# Patient Record
Sex: Male | Born: 2005 | Race: White | Hispanic: Yes | Marital: Single | Smoking: Never smoker
Health system: Southern US, Community
[De-identification: ages and names within clinical notes are randomized; demographics above are authoritative.]

---
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
## 2023-01-14 IMAGING — DX DG FINGER LITTLE 2+V*R*
3 series · 3 of 3 positions shown · non-contrast
Comparison: None.

CLINICAL DATA: Volleyball injury

EXAM:
RIGHT LITTLE FINGER 2+V

[finger ap]
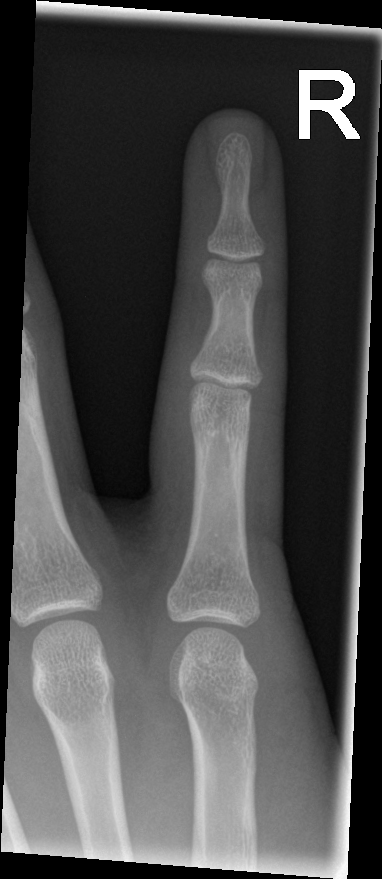

[finger obl]
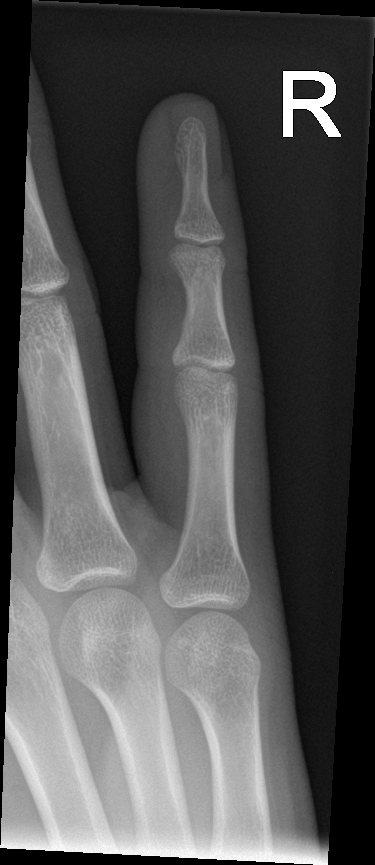

[finger lat]
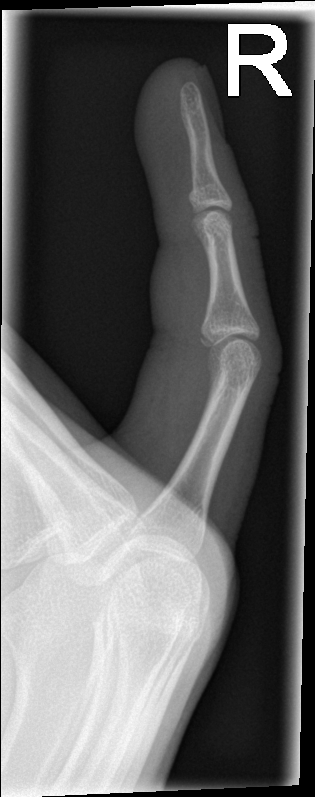

[3 of 3 positions shown; findings below may reference images not displayed]

FINDINGS: Minimally displaced fracture along the volar base of the middle
phalanx, could reflect a volar plate avulsion fracture. No other
acute fracture or traumatic malalignment.
IMPRESSION: Small avulsion type fracture at the base of the fifth middle
phalanx, concern for volar plate avulsion, correlate with mechanism
and clinical symptoms.

## 2023-05-27 NOTE — Patient Instructions (Signed)
 Patient Education Table of Contents Acetaminophen  Capsules or Tablets Cyclobenzaprine Tablets Docusate Capsules or Tablets Lumbar Spine Fracture Oxycodone Capsules or Tablets  To view videos and all your education online visit, [email or web address] or scan this QR code with your smartphone. Access to this content will expire in one year. Acetaminophen  Capsules or Tablets What is this medication? ACETAMINOPHEN  (a set a MEE noe fen) treats mild to moderate pain. It may also be used to reduce fever. This medicine may be used for other purposes; ask your health care provider or pharmacist if you have questions. COMMON BRAND NAME(S): Aceta, Actamin, Anacin Aspirin Free, Aphen, Genapap, Genebs, Mapap, Pain & Fever, Pain and Fever, PAIN RELIEF, PAIN RELIEF Extra Strength, Pain Reliever, Panadol, PHARBETOL, Plus PHARMA, Q-Pap, Q-Pap Extra Strength, Tylenol , Tylenol  CrushableTablet, Tylenol  Extra Strength, Tylenol  Regular Strength, XS No Aspirin, XS Pain Reliever What should I tell my care team before I take this medication? They need to know if you have any of these conditions: Frequently drink alcohol Liver disease An unusual or allergic reaction to acetaminophen , other medications, foods, dyes, or preservatives Pregnant or trying to get pregnant Breastfeeding How should I use this medication? Take this medication by mouth with a glass of water. Take it as directed on the package or prescription label. Take your medication at regular intervals. Do not take your medication more often than directed. Talk to your care team about the use of this medication in children. While it may be prescribed for children as young as 68 years of age for selected conditions, precautions do apply. Overdosage: If you think you have taken too much of this medicine contact a poison control center or emergency room at once. NOTE: This medicine is only for you. Do not share this medicine with others. What if  I miss a dose? If you miss a dose, take it as soon as you can. If it is almost time for your next dose, take only that dose. Do not take double or extra doses. What may interact with this medication? Alcohol Imatinib Isoniazid Other medications with acetaminophen  This list may not describe all possible interactions. Give your health care provider a list of all the medicines, herbs, non-prescription drugs, or dietary supplements you use. Also tell them if you smoke, drink alcohol, or use illegal drugs. Some items may interact with your medicine. What should I watch for while using this medication? Tell your care team if the pain lasts more than 10 days (5 days for children), if it gets worse, or if there is a new or different kind of pain. Also, check with your care team if a fever lasts for more than 3 days. Do not take other medications that contain acetaminophen  with this medication. Many non-prescription medications contain acetaminophen . Always read labels carefully. If you have questions, ask your care team. If you take too much acetaminophen , get medical help right away. Too much acetaminophen  can be very dangerous and cause liver damage. Even if you do not have symptoms, it is important to get help right away. What side effects may I notice from receiving this medication? Side effects that you should report to your care team as soon as possible: Allergic reactions--skin rash, itching, hives, swelling of the face, lips, tongue, or throat Liver injury--right upper belly pain, loss of appetite, nausea, light-colored stool, dark yellow or brown urine, yellowing skin or eyes, unusual weakness or fatigue Redness, blistering, peeling, or loosening of the skin, including inside the mouth Side effects that usually do  not require medical attention (report to your care team if they continue or are bothersome): Headache Nausea Trouble sleeping Upset stomach This list may not describe all possible  side effects. Call your doctor for medical advice about side effects. You may report side effects to FDA at 1-800-FDA-1088. Where should I keep my medication? Keep out of the reach of children and pets. Store at room temperature between 20 and 25 degrees C (68 and 77 degrees F). Protect from moisture and heat. Get rid of any unused medication after the expiration date. To get rid of medications that are no longer wanted or have expired: Take the medication to a medication take-back program. Ask your pharmacy or law enforcement to find a location. If you cannot return the medication, check the label or package insert to see if the medication should be thrown out in the garbage or flushed down the toilet. If you are not sure, ask your care team. If it is safe to put it in the trash, empty the medication out of the container. Mix the medication with cat litter, dirt, coffee grounds, or other unwanted substance. Put it in the trash. NOTE: This sheet is a summary. It may not cover all possible information. If you have questions about this medicine, talk to your doctor, pharmacist, or health care provider.  2024 Elsevier/Gold Standard (2022-06-09) Cyclobenzaprine Tablets What is this medication? CYCLOBENZAPRINE (sye kloe BEN za preen) treats muscle spasms. It works by relaxing your muscles, which reduces muscle stiffness. It belongs to a group of medications called muscle relaxants. This medicine may be used for other purposes; ask your health care provider or pharmacist if you have questions. COMMON BRAND NAME(S): Fexmid, Flexeril What should I tell my care team before I take this medication? They need to know if you have any of these conditions: Heart disease, irregular heartbeat, or previous heart attack Liver disease Thyroid problem An unusual or allergic reaction to cyclobenzaprine, tricyclic antidepressants, lactose, other medications, foods, dyes, or preservatives Pregnant or trying to get  pregnant Breastfeeding How should I use this medication? Take this medication by mouth with a glass of water. Take it as directed on the prescription label. You can take it with or without food. If it upsets your stomach, take it with food. Do not take it more often than directed. Talk to your care team about the use of this medication in children. Special care may be needed. Overdosage: If you think you have taken too much of this medicine contact a poison control center or emergency room at once. NOTE: This medicine is only for you. Do not share this medicine with others. What if I miss a dose? If you miss a dose, take it as soon as you can. If it is almost time for your next dose, take only that dose. Do not take double or extra doses. What may interact with this medication? Do not take this medication with any of the following: MAOIs, such as Carbex, Eldepryl, Marplan, Nardil, and Parnate Opioid medications for cough Safinamide This medication may also interact with the following: Alcohol Bupropion Antihistamines for allergy, cough, and cold Certain medications for anxiety or sleep Certain medications for bladder problems, such as oxybutynin, tolterodine Certain medications for depression, such as amitriptyline, fluoxetine, sertraline Certain medications for Parkinson disease, such as benztropine, trihexyphenidyl Certain medications for seizures, such as phenobarbital, primidone Certain medications for stomach problems, such as dicyclomine, hyoscyamine Certain medications for travel sickness, such as scopolamine General anesthetics, such as halothane,  isoflurane, methoxyflurane, propofol Ipratropium Local anesthetics, such as lidocaine , pramoxine, tetracaine Medications that relax muscles for surgery Opioid medications for pain Phenothiazines, such as chlorpromazine, mesoridazine, prochlorperazine, thioridazine Verapamil This list may not describe all possible interactions. Give  your health care provider a list of all the medicines, herbs, non-prescription drugs, or dietary supplements you use. Also tell them if you smoke, drink alcohol, or use illegal drugs. Some items may interact with your medicine. What should I watch for while using this medication? Visit your care team for regular checks on your progress. Tell your care team if your symptoms do not start to get better or if they get worse. This medication may affect your coordination, reaction time, or judgment. Do not drive or operate machinery until you know how this medication affects you. Sit up or stand slowly to reduce the risk of dizzy or fainting spells. Drinking alcohol with this medication can increase the risk of these side effects. Taking this medication with other substances that cause drowsiness, such as alcohol, benzodiazepines, or other opioids can cause serious side effects. Give your care team a list of all medications you use. They will tell you how much medication to take. Do not take more medication than directed. Call emergency services if you have problems breathing or staying awake. Your mouth may get dry. Chewing sugarless gum or sucking hard candy and drinking plenty of water may help. Contact your care team if the problem does not go away or is severe. What side effects may I notice from receiving this medication? Side effects that you should report to your care team as soon as possible: Allergic reactions--skin rash, itching, hives, swelling of the face, lips, tongue, or throat CNS depression--slow or shallow breathing, shortness of breath, feeling faint, dizziness, confusion, trouble staying awake Heart rhythm changes--fast or irregular heartbeat, dizziness, feeling faint or lightheaded, chest pain, trouble breathing Side effects that usually do not require medical attention (report to your care team if they continue or are bothersome): Constipation Dizziness Drowsiness Dry  mouth Fatigue Nausea This list may not describe all possible side effects. Call your doctor for medical advice about side effects. You may report side effects to FDA at 1-800-FDA-1088. Where should I keep my medication? Keep out of the reach of children and pets. Store at room temperature between 20 and 25 degrees C (68 and 77 degrees F). Keep container tightly closed. Get rid of any unused medication after the expiration date. To get rid of medications that are no longer needed or have expired: Take the medication to a medication take-back program. Check with your pharmacy or law enforcement to find a location. If you cannot return the medication, check the label or package insert to see if the medication should be thrown out in the garbage or flushed down the toilet. If you are not sure, ask your care team. If it is safe to put it in the trash, empty the medication out of the container. Mix the medication with cat litter, dirt, coffee grounds, or other unwanted substance. Seal the mixture in a bag or container. Put it in the trash. NOTE: This sheet is a summary. It may not cover all possible information. If you have questions about this medicine, talk to your doctor, pharmacist, or health care provider.  2024 Elsevier/Gold Standard (2022-04-18) Docusate Capsules or Tablets What is this medication? DOCUSATE (doc CUE sayt) prevents and treats occasional constipation. It works by softening the stool, making it easier to have a bowel movement.  It belongs to a group of medications called laxatives. This medicine may be used for other purposes; ask your health care provider or pharmacist if you have questions. COMMON BRAND NAME(S): [employer] Stool Softner, Colace, Colace Clear, Correctol, D.O.S., DC, Doc-Q-Lace, DocuLace, Docusoft S, DOK, DOK Extra Strength, Dulcolax, Dulcolax Pink, Genasoft, Kao-Tin, Kaopectate Liqui-Gels, Phillips Stool Softener, Plus PHARMA, Stool Softener, Stool Softener DC, Stool  Softener Extra Strength, Sulfolax, Sur-Q-Lax, Surfak, Uni-Ease What should I tell my care team before I take this medication? They need to know if you have any of these conditions: Nausea or vomiting Severe constipation Stomach pain Sudden change in bowel habit lasting more than 2 weeks An unusual or allergic reaction to docusate, other medications, foods, dyes, or preservatives Pregnant or trying to get pregnant Breast-feeding How should I use this medication? Take this medication by mouth with a glass of water. Take it as directed on the label. Do not take it more often than directed. Talk to your care team about the use of this medication in children. While it may be prescribed for children as young as 2 years for selected conditions, precautions do apply. Overdosage: If you think you have taken too much of this medicine contact a poison control center or emergency room at once. NOTE: This medicine is only for you. Do not share this medicine with others. What if I miss a dose? If you miss a dose, take it as soon as you can. If it is almost time for your next dose, take only that dose. Do not take double or extra doses. What may interact with this medication? Mineral oil This list may not describe all possible interactions. Give your health care provider a list of all the medicines, herbs, non-prescription drugs, or dietary supplements you use. Also tell them if you smoke, drink alcohol, or use illegal drugs. Some items may interact with your medicine. What should I watch for while using this medication? Do not use for more than one week without advice from your care team. If your constipation returns, check with your care team. Drink plenty of water while taking this medication. Drinking water helps decrease constipation. Stop using this medication and contact your care team if you experience any rectal bleeding or do not have a bowel movement after use. These could be signs of a more  serious condition. What side effects may I notice from receiving this medication? Side effects that you should report to your care team as soon as possible: Allergic reactions--skin rash, itching, hives, swelling of the face, lips, tongue, or throat Side effects that usually do not require medical attention (report to your care team if they continue or are bothersome): Diarrhea Stomach pain This list may not describe all possible side effects. Call your doctor for medical advice about side effects. You may report side effects to FDA at 1-800-FDA-1088. Where should I keep my medication? Keep out of the reach of children and pets. Store at room temperature between 15 and 30 degrees C (59 and 86 degrees F). Throw away any unused medication after the expiration date. NOTE: This sheet is a summary. It may not cover all possible information. If you have questions about this medicine, talk to your doctor, pharmacist, or health care provider.  2024 Elsevier/Gold Standard (2022-05-15) Lumbar Spine Fracture  A lumbar spine fracture is a break in one of the bones of the lower back. Lumbar spine fractures can vary from mild to severe. The most severe types are those that: Cause  the broken bones to move out of place (unstable). Injure or press on the spinal cord. Have multiple breaks in the bones and damage to nearby tissues (complex). During recovery, it is normal to have pain and stiffness in the lower back for several weeks. What are the causes? This condition may be caused by: A fall. A car accident. A gunshot wound. A hard, direct hit to the back. What increases the risk? You are more likely to develop this condition if: You are in a situation that could result in a fall or other violent injury. You have a condition that causes weakness in the bones (osteoporosis). What are the signs or symptoms? The main symptom of this condition is severe pain in the lower back. If a fracture is complex or  severe, there may also be: An unusual shape or swollen area on the lower back. Limited ability to move an area of the lower back. Inability to empty the bladder (urinary retention). Inability to control when you urinate or have bowel movements (incontinence). Loss of strength or sensation in the legs, feet, and toes. Inability to move (paralysis). How is this diagnosed? This condition is diagnosed based on: A physical exam. Symptoms and what happened just before they developed. The results of imaging tests, such as an X-ray, CT scan, or MRI. If your nerves have been damaged, you may also have other tests to find out the extent of the damage. How is this treated? Treatment for this condition depends on how severe the injury is. Most fractures can be treated with: A back brace. Bed rest and limits on your activity. Pain medicine. Physical therapy. Fractures that are complex, involve multiple bones, or make the spine unstable may require surgery. Surgery is done: To remove pressure from the nerves or spinal cord. To stabilize the broken pieces of bone. Follow these instructions at home: Medicines Take over-the-counter and prescription medicines only as told by your health care provider. Ask your health care provider if the medicine prescribed to you: Requires you to avoid driving or using machinery. Can cause constipation. You may need to take these actions to prevent or treat constipation: Drink enough fluid to keep your urine pale yellow.  Take over-the-counter or prescription medicines. Eat foods that are high in fiber, such as beans, whole grains, and fresh fruits and vegetables. Limit foods that are high in fat and processed sugars, such as fried or sweet foods.  If you have a back brace: Wear the back brace as told by your health care provider. Remove it only as told by your health care provider. Check the skin around the brace every day. Tell your health care provider about any  concerns. Keep the brace clean. If the brace is not waterproof: Do not let it get wet. Cover it with a watertight covering when you take a bath or a shower. Ask your health care provider when it is safe to drive. Activity Rest as told by your health care provider. Do exercises as told by your health care provider. Return to your normal activities as told by your health care provider. Ask your health care provider what activities are safe for you. Managing pain, stiffness, and swelling  If directed, put ice on the injured area. To do this: If you have a removable brace, remove it only as told by your health care provider. Put ice in a plastic bag. Place a towel between your skin and the bag. Leave the ice on for 20 minutes, 2-3  times a day. If your skin turns bright red, remove the ice right away to prevent skin damage. The risk of skin damage is higher if you cannot feel pain, heat, or cold. General instructions Do not use any products that contain nicotine or tobacco. These products include cigarettes, chewing tobacco, and vaping devices, such e-cigarettes. These can delay bone healing. If you need help quitting, ask your health care provider. Do not drink alcohol. Keep all follow-up visits. Failing to follow up as recommended could result in permanent injury, disability, or long-lasting (chronic) pain. Where to find more information [employer]: orthoinfo.aaos.org Contact a health care provider if: You have a fever. Your pain medicine is not helping. Your pain does not get better over time. You cannot return to your normal activities as planned or expected. Get help right away if: You have difficulty breathing. Your pain is very bad and it suddenly gets worse. You have numbness, tingling, or weakness in any part of your body. You are unable to empty your bladder. You cannot control when you urinate or have bowel movements. You are unable to move any  body part that is below the level of your injury. You have pain in your abdomen or uncontrolled vomiting. You have a warm, tender swelling in your leg. These symptoms may be an emergency. Get help right away. Call 911. Do not wait to see if the symptoms will go away. Do not drive yourself to the hospital. Summary A lumbar spine fracture is a break in one of the bones of the lower back. The main symptom of this condition is severe pain in the lower back. If a fracture is complex or severe, there may also be numbness, tingling, or paralysis in the legs. Most fractures can be treated with a back brace, pain medicine, bed rest and limits on activity, and physical therapy. Fractures that are complex, involve multiple bones, or make the spine unstable may require surgery. This information is not intended to replace advice given to you by your health care provider. Make sure you discuss any questions you have with your health care provider. Document Released: 2007-01-28 Document Updated: 2022-02-07 Document Reviewed: 2022-02-07 Elsevier Patient Education  2024 [employer]. Oxycodone Capsules or Tablets What is this medication? OXYCODONE (ox i KOE done) treats severe pain. It is prescribed when other pain medications do not work well enough or cannot be tolerated. It works by blocking pain signals in the brain. It belongs to a group of medications called opioids. This medicine may be used for other purposes; ask your health care provider or pharmacist if you have questions. COMMON BRAND NAME(S): Dazidox, Endocodone, Oxaydo, OXECTA, OxyIR, Percolone, Roxicodone, Roxybond What should I tell my care team before I take this medication? They need to know if you have any of these conditions: Brain tumor Frequently drink alcohol Head injury Heart disease History of pancreatitis Kidney disease Liver disease Low adrenal gland function Lung or breathing disease, such as asthma Seizures Stomach or  intestine problems Substance use disorder Taken an MAOI such as Marplan, Nardil, or Parnate in the last 14 days An unusual or allergic reaction to oxycodone, other medications, foods, dyes, or preservatives Pregnant or trying to get pregnant Breastfeeding How should I use this medication? Take this medication by mouth with water. Take it as directed on the prescription label at the same time every day. You can take it with or without food. If it upsets your stomach, take it with food. Keep  taking it unless your care team tells you to stop. Some brands of this medication, like Oxaydo, have special instructions. Ask your care team or pharmacist if these directions are for you: Do not cut, crush or chew this medication. Do not wet, soak, or lick the tablet before you take it. A special MedGuide will be given to you by the pharmacist with each prescription and refill. Be sure to read this information carefully each time. Talk to your care team regarding the use of this medication in children. Special care may be needed. Overdosage: If you think you have taken too much of this medicine contact a poison control center or emergency room at once. NOTE: This medicine is only for you. Do not share this medicine with others. What if I miss a dose? If you miss a dose, take it as soon as you can. If it is almost time for your next dose, take only that dose. Do not take double or extra doses. What may interact with this medication? Do not take this medication with any of the following: Safinamide This medication may interact with the following: Alcohol Antihistamines for allergy, cough, and cold Atropine Certain antivirals for HIV or hepatitis Certain antibiotics, such as clarithromycin, erythromycin, linezolid, rifampin Certain medications for anxiety or sleep Certain medications for bladder problems, such as oxybutynin, tolterodine Certain medications for depression, such as amitriptyline, fluoxetine,  sertraline Certain medications for fungal infections, such as ketoconazole, itraconazole, posaconazole Certain medications for migraine headache, such as almotriptan, eletriptan, frovatriptan, naratriptan, rizatriptan, sumatriptan, zolmitriptan Certain medications for nausea or vomiting, such as dolasetron, granisetron, ondansetron, palonosetron Certain medications for Parkinson disease, such as benztropine, trihexyphenidyl Certain medications for seizures, such as carbamazepine, phenobarbital, phenytoin, primidone Certain medications for stomach problems, such as dicyclomine, hyoscyamine Certain medications for travel sickness, such as scopolamine Diuretics General anesthetics, such as halothane, isoflurane, methoxyflurane, propofol Ipratropium MAOIs, such as Marplan, Nardil, and Parnate Medications that relax muscles Methylene blue Other opioid medications for pain or cough Phenothiazines, such as chlorpromazine, mesoridazine, prochlorperazine, thioridazine This list may not describe all possible interactions. Give your health care provider a list of all the medicines, herbs, non-prescription drugs, or dietary supplements you use. Also tell them if you smoke, drink alcohol, or use illegal drugs. Some items may interact with your medicine. What should I watch for while using this medication? Tell your care team if your pain does not go away, if it gets worse, or if you have new or a different type of pain. You may develop tolerance to this medication. Tolerance means that you will need a higher dose of the medication for pain relief. Tolerance is normal and is expected if you take this medication for a long time. Taking this medication with other substances that cause drowsiness, such as alcohol, benzodiazepines, or other opioids can cause serious side effects. Give your care team a list of all medications you use. They will tell you how much medication to take. Do not take more medication than  directed. Call emergency services if you have problems breathing or staying awake. Long term use of this medication may cause your brain and body to depend on it. This can happen even when used as directed by your care team. You and your care team will work together to determine how long you will need to take this medication. If your care team wants you to stop this medication, the dose will be slowly lowered over time to reduce the risk of side effects. Naloxone  is an emergency medication used for an opioid overdose. An overdose can happen if you take too much of an opioid. It can also happen if an opioid is taken with some other medications or substances such as alcohol. Know the symptoms of an overdose, such as trouble breathing, unusually tired or sleepy, or not being able to respond or wake up. Make sure to tell caregivers and close contacts where your naloxone is stored. Make sure they know how to use it. After naloxone is given, the person giving it must call emergency services. Naloxone is a temporary treatment. Repeat doses may be needed. This medication may affect your coordination, reaction time, or judgment. Do not drive or operate machinery until you know how this medication affects you. Sit up or stand slowly to reduce the risk of dizzy or fainting spells. Drinking alcohol with this medication can increase the risk of these side effects. This medication will cause constipation. If you do not have a bowel movement for 3 days, call your care team. Your mouth may get dry. Chewing sugarless gum or sucking hard candy and drinking plenty of water may help. Contact your care team if the problem does not go away or is severe. Talk to your care team if you may be pregnant. Prolonged use of this medication during pregnancy can cause temporary withdrawal in a newborn. Talk to your care team before breastfeeding. Changes to your treatment plan may be needed. If you breastfeed while taking this medication,  seek medical care right away if you notice the child has slow or noisy breathing, is unusually sleepy or not able to wake up, or is limp. Long-term use of this medication may cause infertility. Talk to your care team if you are concerned about your fertility. What side effects may I notice from receiving this medication? Side effects that you should report to your care team as soon as possible: Allergic reactions--skin rash, itching, hives, swelling of the face, lips, tongue, or throat CNS depression--slow or shallow breathing, shortness of breath, feeling faint, dizziness, confusion, difficulty staying awake Low adrenal gland function--nausea, vomiting, loss of appetite, unusual weakness or fatigue, dizziness Low blood pressure--dizziness, feeling faint or lightheaded, blurry vision Side effects that usually do not require medical attention (report to your care team if they continue or are bothersome): Constipation Dizziness Drowsiness Dry mouth Headache Nausea Vomiting This list may not describe all possible side effects. Call your doctor for medical advice about side effects. You may report side effects to FDA at 1-800-FDA-1088. Where should I keep my medication? Keep out of the reach of children and pets. This medication can be abused. Keep it in a safe place to protect it from theft. Do not share it with anyone. It is only for you. Selling or giving away this medication is dangerous and against the law. Store at [place] (77 degrees F). Protect from light and moisture. Keep the container tightly closed. Get rid of any unused medication after the expiration date. This medication may cause harm and death if it is taken by other adults, children, or pets. It is important to get rid of the medication as soon as you no longer need it, or it is expired. You can do this in two ways: Take the medication to a medication take-back program. Check with your pharmacy or law enforcement to find a  location. If you cannot return the medication, flush it down the toilet. NOTE: This sheet is a summary. It may not cover  all possible information. If you have questions about this medicine, talk to your doctor, pharmacist, or health care provider.  2024 [employer] Standard (2022-11-13)

## 2023-05-27 NOTE — Patient Instructions (Signed)
 Patient Education Table of Contents Cyclobenzaprine Tablets Docusate Capsules or Tablets Lumbar Spine Fracture Oxycodone Capsules or Tablets  To view videos and all your education online visit, [email or web address] or scan this QR code with your smartphone. Access to this content will expire in one year. Cyclobenzaprine Tablets What is this medication? CYCLOBENZAPRINE (sye kloe BEN za preen) treats muscle spasms. It works by relaxing your muscles, which reduces muscle stiffness. It belongs to a group of medications called muscle relaxants. This medicine may be used for other purposes; ask your health care provider or pharmacist if you have questions. COMMON BRAND NAME(S): Fexmid, Flexeril What should I tell my care team before I take this medication? They need to know if you have any of these conditions: Heart disease, irregular heartbeat, or previous heart attack Liver disease Thyroid problem An unusual or allergic reaction to cyclobenzaprine, tricyclic antidepressants, lactose, other medications, foods, dyes, or preservatives Pregnant or trying to get pregnant Breastfeeding How should I use this medication? Take this medication by mouth with a glass of water. Take it as directed on the prescription label. You can take it with or without food. If it upsets your stomach, take it with food. Do not take it more often than directed. Talk to your care team about the use of this medication in children. Special care may be needed. Overdosage: If you think you have taken too much of this medicine contact a poison control center or emergency room at once. NOTE: This medicine is only for you. Do not share this medicine with others. What if I miss a dose? If you miss a dose, take it as soon as you can. If it is almost time for your next dose, take only that dose. Do not take double or extra doses. What may interact with this medication? Do not take this medication with any of the  following: MAOIs, such as Carbex, Eldepryl, Marplan, Nardil, and Parnate Opioid medications for cough Safinamide This medication may also interact with the following: Alcohol Bupropion Antihistamines for allergy, cough, and cold Certain medications for anxiety or sleep Certain medications for bladder problems, such as oxybutynin, tolterodine Certain medications for depression, such as amitriptyline, fluoxetine, sertraline Certain medications for Parkinson disease, such as benztropine, trihexyphenidyl Certain medications for seizures, such as phenobarbital, primidone Certain medications for stomach problems, such as dicyclomine, hyoscyamine Certain medications for travel sickness, such as scopolamine General anesthetics, such as halothane, isoflurane, methoxyflurane, propofol Ipratropium Local anesthetics, such as lidocaine , pramoxine, tetracaine Medications that relax muscles for surgery Opioid medications for pain Phenothiazines, such as chlorpromazine, mesoridazine, prochlorperazine, thioridazine Verapamil This list may not describe all possible interactions. Give your health care provider a list of all the medicines, herbs, non-prescription drugs, or dietary supplements you use. Also tell them if you smoke, drink alcohol, or use illegal drugs. Some items may interact with your medicine. What should I watch for while using this medication? Visit your care team for regular checks on your progress. Tell your care team if your symptoms do not start to get better or if they get worse. This medication may affect your coordination, reaction time, or judgment. Do not drive or operate machinery until you know how this medication affects you. Sit up or stand slowly to reduce the risk of dizzy or fainting spells. Drinking alcohol with this medication can increase the risk of these side effects. Taking this medication with other substances that cause drowsiness, such as alcohol, benzodiazepines, or  other opioids can cause serious side effects. Give  your care team a list of all medications you use. They will tell you how much medication to take. Do not take more medication than directed. Call emergency services if you have problems breathing or staying awake. Your mouth may get dry. Chewing sugarless gum or sucking hard candy and drinking plenty of water may help. Contact your care team if the problem does not go away or is severe. What side effects may I notice from receiving this medication? Side effects that you should report to your care team as soon as possible: Allergic reactions--skin rash, itching, hives, swelling of the face, lips, tongue, or throat CNS depression--slow or shallow breathing, shortness of breath, feeling faint, dizziness, confusion, trouble staying awake Heart rhythm changes--fast or irregular heartbeat, dizziness, feeling faint or lightheaded, chest pain, trouble breathing Side effects that usually do not require medical attention (report to your care team if they continue or are bothersome): Constipation Dizziness Drowsiness Dry mouth Fatigue Nausea This list may not describe all possible side effects. Call your doctor for medical advice about side effects. You may report side effects to FDA at 1-800-FDA-1088. Where should I keep my medication? Keep out of the reach of children and pets. Store at room temperature between 20 and 25 degrees C (68 and 77 degrees F). Keep container tightly closed. Get rid of any unused medication after the expiration date. To get rid of medications that are no longer needed or have expired: Take the medication to a medication take-back program. Check with your pharmacy or law enforcement to find a location. If you cannot return the medication, check the label or package insert to see if the medication should be thrown out in the garbage or flushed down the toilet. If you are not sure, ask your care team. If it is safe to put it in the  trash, empty the medication out of the container. Mix the medication with cat litter, dirt, coffee grounds, or other unwanted substance. Seal the mixture in a bag or container. Put it in the trash. NOTE: This sheet is a summary. It may not cover all possible information. If you have questions about this medicine, talk to your doctor, pharmacist, or health care provider.  2024 Elsevier/Gold Standard (2022-04-18) Docusate Capsules or Tablets What is this medication? DOCUSATE (doc CUE sayt) prevents and treats occasional constipation. It works by softening the stool, making it easier to have a bowel movement. It belongs to a group of medications called laxatives. This medicine may be used for other purposes; ask your health care provider or pharmacist if you have questions. COMMON BRAND NAME(S): BeneHealth Stool Softner, Colace, Colace Clear, Correctol, D.O.S., DC, Doc-Q-Lace, DocuLace, Docusoft S, DOK, DOK Extra Strength, Dulcolax, Dulcolax Pink, Genasoft, Kao-Tin, Kaopectate Liqui-Gels, Phillips Stool Softener, Plus PHARMA, Stool Softener, Stool Softener DC, Stool Softener Extra Strength, Sulfolax, Sur-Q-Lax, Surfak, Uni-Ease What should I tell my care team before I take this medication? They need to know if you have any of these conditions: Nausea or vomiting Severe constipation Stomach pain Sudden change in bowel habit lasting more than 2 weeks An unusual or allergic reaction to docusate, other medications, foods, dyes, or preservatives Pregnant or trying to get pregnant Breast-feeding How should I use this medication? Take this medication by mouth with a glass of water. Take it as directed on the label. Do not take it more often than directed. Talk to your care team about the use of this medication in children. While it may be prescribed for children as young  as 2 years for selected conditions, precautions do apply. Overdosage: If you think you have taken too much of this medicine contact a  poison control center or emergency room at once. NOTE: This medicine is only for you. Do not share this medicine with others. What if I miss a dose? If you miss a dose, take it as soon as you can. If it is almost time for your next dose, take only that dose. Do not take double or extra doses. What may interact with this medication? Mineral oil This list may not describe all possible interactions. Give your health care provider a list of all the medicines, herbs, non-prescription drugs, or dietary supplements you use. Also tell them if you smoke, drink alcohol, or use illegal drugs. Some items may interact with your medicine. What should I watch for while using this medication? Do not use for more than one week without advice from your care team. If your constipation returns, check with your care team. Drink plenty of water while taking this medication. Drinking water helps decrease constipation. Stop using this medication and contact your care team if you experience any rectal bleeding or do not have a bowel movement after use. These could be signs of a more serious condition. What side effects may I notice from receiving this medication? Side effects that you should report to your care team as soon as possible: Allergic reactions--skin rash, itching, hives, swelling of the face, lips, tongue, or throat Side effects that usually do not require medical attention (report to your care team if they continue or are bothersome): Diarrhea Stomach pain This list may not describe all possible side effects. Call your doctor for medical advice about side effects. You may report side effects to FDA at 1-800-FDA-1088. Where should I keep my medication? Keep out of the reach of children and pets. Store at room temperature between 15 and 30 degrees C (59 and 86 degrees F). Throw away any unused medication after the expiration date. NOTE: This sheet is a summary. It may not cover all possible information. If  you have questions about this medicine, talk to your doctor, pharmacist, or health care provider.  2024 Elsevier/Gold Standard (2022-05-15) Lumbar Spine Fracture  A lumbar spine fracture is a break in one of the bones of the lower back. Lumbar spine fractures can vary from mild to severe. The most severe types are those that: Cause the broken bones to move out of place (unstable). Injure or press on the spinal cord. Have multiple breaks in the bones and damage to nearby tissues (complex). During recovery, it is normal to have pain and stiffness in the lower back for several weeks. What are the causes? This condition may be caused by: A fall. A car accident. A gunshot wound. A hard, direct hit to the back. What increases the risk? You are more likely to develop this condition if: You are in a situation that could result in a fall or other violent injury. You have a condition that causes weakness in the bones (osteoporosis). What are the signs or symptoms? The main symptom of this condition is severe pain in the lower back. If a fracture is complex or severe, there may also be: An unusual shape or swollen area on the lower back. Limited ability to move an area of the lower back. Inability to empty the bladder (urinary retention). Inability to control when you urinate or have bowel movements (incontinence). Loss of strength or sensation in the legs, feet,  and toes. Inability to move (paralysis). How is this diagnosed? This condition is diagnosed based on: A physical exam. Symptoms and what happened just before they developed. The results of imaging tests, such as an X-ray, CT scan, or MRI. If your nerves have been damaged, you may also have other tests to find out the extent of the damage. How is this treated? Treatment for this condition depends on how severe the injury is. Most fractures can be treated with: A back brace. Bed rest and limits on your activity. Pain  medicine. Physical therapy. Fractures that are complex, involve multiple bones, or make the spine unstable may require surgery. Surgery is done: To remove pressure from the nerves or spinal cord. To stabilize the broken pieces of bone. Follow these instructions at home: Medicines Take over-the-counter and prescription medicines only as told by your health care provider. Ask your health care provider if the medicine prescribed to you: Requires you to avoid driving or using machinery. Can cause constipation. You may need to take these actions to prevent or treat constipation: Drink enough fluid to keep your urine pale yellow.  Take over-the-counter or prescription medicines. Eat foods that are high in fiber, such as beans, whole grains, and fresh fruits and vegetables. Limit foods that are high in fat and processed sugars, such as fried or sweet foods.  If you have a back brace: Wear the back brace as told by your health care provider. Remove it only as told by your health care provider. Check the skin around the brace every day. Tell your health care provider about any concerns. Keep the brace clean. If the brace is not waterproof: Do not let it get wet. Cover it with a watertight covering when you take a bath or a shower. Ask your health care provider when it is safe to drive. Activity Rest as told by your health care provider. Do exercises as told by your health care provider. Return to your normal activities as told by your health care provider. Ask your health care provider what activities are safe for you. Managing pain, stiffness, and swelling  If directed, put ice on the injured area. To do this: If you have a removable brace, remove it only as told by your health care provider. Put ice in a plastic bag. Place a towel between your skin and the bag. Leave the ice on for 20 minutes, 2-3 times a day. If your skin turns bright red, remove the ice right away to prevent skin damage.  The risk of skin damage is higher if you cannot feel pain, heat, or cold. General instructions Do not use any products that contain nicotine or tobacco. These products include cigarettes, chewing tobacco, and vaping devices, such e-cigarettes. These can delay bone healing. If you need help quitting, ask your health care provider. Do not drink alcohol. Keep all follow-up visits. Failing to follow up as recommended could result in permanent injury, disability, or long-lasting (chronic) pain. Where to find more information American Academy [employer]: orthoinfo.aaos.org Contact a health care provider if: You have a fever. Your pain medicine is not helping. Your pain does not get better over time. You cannot return to your normal activities as planned or expected. Get help right away if: You have difficulty breathing. Your pain is very bad and it suddenly gets worse. You have numbness, tingling, or weakness in any part of your body. You are unable to empty your bladder. You cannot control when you urinate or  have bowel movements. You are unable to move any body part that is below the level of your injury. You have pain in your abdomen or uncontrolled vomiting. You have a warm, tender swelling in your leg. These symptoms may be an emergency. Get help right away. Call 911. Do not wait to see if the symptoms will go away. Do not drive yourself to the hospital. Summary A lumbar spine fracture is a break in one of the bones of the lower back. The main symptom of this condition is severe pain in the lower back. If a fracture is complex or severe, there may also be numbness, tingling, or paralysis in the legs. Most fractures can be treated with a back brace, pain medicine, bed rest and limits on activity, and physical therapy. Fractures that are complex, involve multiple bones, or make the spine unstable may require surgery. This information is not intended to replace advice given to  you by your health care provider. Make sure you discuss any questions you have with your health care provider. Document Released: 2007-01-28 Document Updated: 2022-02-07 Document Reviewed: 2022-02-07 Elsevier Patient Education  2024 [employer]. Oxycodone Capsules or Tablets What is this medication? OXYCODONE (ox i KOE done) treats severe pain. It is prescribed when other pain medications do not work well enough or cannot be tolerated. It works by blocking pain signals in the brain. It belongs to a group of medications called opioids. This medicine may be used for other purposes; ask your health care provider or pharmacist if you have questions. COMMON BRAND NAME(S): Dazidox, Endocodone, Oxaydo, OXECTA, OxyIR, Percolone, Roxicodone, Roxybond What should I tell my care team before I take this medication? They need to know if you have any of these conditions: Brain tumor Frequently drink alcohol Head injury Heart disease History of pancreatitis Kidney disease Liver disease Low adrenal gland function Lung or breathing disease, such as asthma Seizures Stomach or intestine problems Substance use disorder Taken an MAOI such as Marplan, Nardil, or Parnate in the last 14 days An unusual or allergic reaction to oxycodone, other medications, foods, dyes, or preservatives Pregnant or trying to get pregnant Breastfeeding How should I use this medication? Take this medication by mouth with water. Take it as directed on the prescription label at the same time every day. You can take it with or without food. If it upsets your stomach, take it with food. Keep taking it unless your care team tells you to stop. Some brands of this medication, like Oxaydo, have special instructions. Ask your care team or pharmacist if these directions are for you: Do not cut, crush or chew this medication. Do not wet, soak, or lick the tablet before you take it. A special MedGuide will be given to you by the pharmacist  with each prescription and refill. Be sure to read this information carefully each time. Talk to your care team regarding the use of this medication in children. Special care may be needed. Overdosage: If you think you have taken too much of this medicine contact a poison control center or emergency room at once. NOTE: This medicine is only for you. Do not share this medicine with others. What if I miss a dose? If you miss a dose, take it as soon as you can. If it is almost time for your next dose, take only that dose. Do not take double or extra doses. What may interact with this medication? Do not take this medication with any of the following: Safinamide  This medication may interact with the following: Alcohol Antihistamines for allergy, cough, and cold Atropine Certain antivirals for HIV or hepatitis Certain antibiotics, such as clarithromycin, erythromycin, linezolid, rifampin Certain medications for anxiety or sleep Certain medications for bladder problems, such as oxybutynin, tolterodine Certain medications for depression, such as amitriptyline, fluoxetine, sertraline Certain medications for fungal infections, such as ketoconazole, itraconazole, posaconazole Certain medications for migraine headache, such as almotriptan, eletriptan, frovatriptan, naratriptan, rizatriptan, sumatriptan, zolmitriptan Certain medications for nausea or vomiting, such as dolasetron, granisetron, ondansetron, palonosetron Certain medications for Parkinson disease, such as benztropine, trihexyphenidyl Certain medications for seizures, such as carbamazepine, phenobarbital, phenytoin, primidone Certain medications for stomach problems, such as dicyclomine, hyoscyamine Certain medications for travel sickness, such as scopolamine Diuretics General anesthetics, such as halothane, isoflurane, methoxyflurane, propofol Ipratropium MAOIs, such as Marplan, Nardil, and Parnate Medications that relax  muscles Methylene blue Other opioid medications for pain or cough Phenothiazines, such as chlorpromazine, mesoridazine, prochlorperazine, thioridazine This list may not describe all possible interactions. Give your health care provider a list of all the medicines, herbs, non-prescription drugs, or dietary supplements you use. Also tell them if you smoke, drink alcohol, or use illegal drugs. Some items may interact with your medicine. What should I watch for while using this medication? Tell your care team if your pain does not go away, if it gets worse, or if you have new or a different type of pain. You may develop tolerance to this medication. Tolerance means that you will need a higher dose of the medication for pain relief. Tolerance is normal and is expected if you take this medication for a long time. Taking this medication with other substances that cause drowsiness, such as alcohol, benzodiazepines, or other opioids can cause serious side effects. Give your care team a list of all medications you use. They will tell you how much medication to take. Do not take more medication than directed. Call emergency services if you have problems breathing or staying awake. Long term use of this medication may cause your brain and body to depend on it. This can happen even when used as directed by your care team. You and your care team will work together to determine how long you will need to take this medication. If your care team wants you to stop this medication, the dose will be slowly lowered over time to reduce the risk of side effects. Naloxone is an emergency medication used for an opioid overdose. An overdose can happen if you take too much of an opioid. It can also happen if an opioid is taken with some other medications or substances such as alcohol. Know the symptoms of an overdose, such as trouble breathing, unusually tired or sleepy, or not being able to respond or wake up. Make sure to tell  caregivers and close contacts where your naloxone is stored. Make sure they know how to use it. After naloxone is given, the person giving it must call emergency services. Naloxone is a temporary treatment. Repeat doses may be needed. This medication may affect your coordination, reaction time, or judgment. Do not drive or operate machinery until you know how this medication affects you. Sit up or stand slowly to reduce the risk of dizzy or fainting spells. Drinking alcohol with this medication can increase the risk of these side effects. This medication will cause constipation. If you do not have a bowel movement for 3 days, call your care team. Your mouth may get dry. Chewing sugarless gum or sucking  hard candy and drinking plenty of water may help. Contact your care team if the problem does not go away or is severe. Talk to your care team if you may be pregnant. Prolonged use of this medication during pregnancy can cause temporary withdrawal in a newborn. Talk to your care team before breastfeeding. Changes to your treatment plan may be needed. If you breastfeed while taking this medication, seek medical care right away if you notice the child has slow or noisy breathing, is unusually sleepy or not able to wake up, or is limp. Long-term use of this medication may cause infertility. Talk to your care team if you are concerned about your fertility. What side effects may I notice from receiving this medication? Side effects that you should report to your care team as soon as possible: Allergic reactions--skin rash, itching, hives, swelling of the face, lips, tongue, or throat CNS depression--slow or shallow breathing, shortness of breath, feeling faint, dizziness, confusion, difficulty staying awake Low adrenal gland function--nausea, vomiting, loss of appetite, unusual weakness or fatigue, dizziness Low blood pressure--dizziness, feeling faint or lightheaded, blurry vision Side effects that usually do  not require medical attention (report to your care team if they continue or are bothersome): Constipation Dizziness Drowsiness Dry mouth Headache Nausea Vomiting This list may not describe all possible side effects. Call your doctor for medical advice about side effects. You may report side effects to FDA at 1-800-FDA-1088. Where should I keep my medication? Keep out of the reach of children and pets. This medication can be abused. Keep it in a safe place to protect it from theft. Do not share it with anyone. It is only for you. Selling or giving away this medication is dangerous and against the law. Store at toysrus C (77 degrees F). Protect from light and moisture. Keep the container tightly closed. Get rid of any unused medication after the expiration date. This medication may cause harm and death if it is taken by other adults, children, or pets. It is important to get rid of the medication as soon as you no longer need it, or it is expired. You can do this in two ways: Take the medication to a medication take-back program. Check with your pharmacy or law enforcement to find a location. If you cannot return the medication, flush it down the toilet. NOTE: This sheet is a summary. It may not cover all possible information. If you have questions about this medicine, talk to your doctor, pharmacist, or health care provider.  2024 [employer] (2022-11-13)

## 2024-11-11 ENCOUNTER — Ambulatory Visit (HOSPITAL_COMMUNITY)
Admission: RE | Admit: 2024-11-11 | Discharge: 2024-11-11 | Disposition: A | Discharge status: Home / Self Care | Payer: Self-pay | Source: Ambulatory Visit | Attending: Family Medicine | Admitting: Family Medicine

## 2024-11-11 ENCOUNTER — Encounter (HOSPITAL_COMMUNITY): Payer: Self-pay

## 2024-11-11 DIAGNOSIS — Z202 Contact with and (suspected) exposure to infections with a predominantly sexual mode of transmission: Secondary | ICD-10-CM | POA: Insufficient documentation

## 2024-11-11 LAB — HIV ANTIBODY (ROUTINE TESTING W REFLEX): HIV Screen 4th Generation wRfx: NONREACTIVE

## 2024-11-14 ENCOUNTER — Encounter (HOSPITAL_COMMUNITY): Payer: Self-pay | Admitting: *Deleted

## 2024-11-15 LAB — CYTOLOGY, (ORAL, ANAL, URETHRAL) ANCILLARY ONLY
Chlamydia: NEGATIVE
Comment: NEGATIVE
Comment: NEGATIVE
Comment: NORMAL
Neisseria Gonorrhea: NEGATIVE
Trichomonas: NEGATIVE
